# Patient Record
Sex: Female | Born: 1950 | Hispanic: Yes | Marital: Married
Health system: Southern US, Community
[De-identification: ages and names within clinical notes are randomized; demographics above are authoritative.]

## PROBLEM LIST (undated history)

## (undated) DIAGNOSIS — I10 Essential (primary) hypertension: Secondary | ICD-10-CM

## (undated) HISTORY — PX: ANKLE FRACTURE SURGERY: SHX122

---

## 2018-12-30 ENCOUNTER — Encounter (HOSPITAL_COMMUNITY): Payer: Self-pay | Admitting: *Deleted

## 2018-12-30 ENCOUNTER — Other Ambulatory Visit: Payer: Self-pay

## 2018-12-30 ENCOUNTER — Emergency Department (HOSPITAL_COMMUNITY)
Admission: EM | Admit: 2018-12-30 | Discharge: 2018-12-30 | Disposition: A | Discharge status: Home / Self Care | Payer: Medicare HMO | Attending: Emergency Medicine | Admitting: Emergency Medicine

## 2018-12-30 ENCOUNTER — Emergency Department (HOSPITAL_COMMUNITY): Payer: Medicare HMO

## 2018-12-30 DIAGNOSIS — I10 Essential (primary) hypertension: Secondary | ICD-10-CM | POA: Diagnosis not present

## 2018-12-30 DIAGNOSIS — M5431 Sciatica, right side: Secondary | ICD-10-CM

## 2018-12-30 DIAGNOSIS — M79604 Pain in right leg: Secondary | ICD-10-CM | POA: Diagnosis present

## 2018-12-30 HISTORY — DX: Essential (primary) hypertension: I10

## 2018-12-30 MED ORDER — PREDNISONE 10 MG (21) PO TBPK: ORAL_TABLET | Freq: Every day | ORAL | 0 refills | Status: AC

## 2018-12-30 MED ORDER — TRAMADOL HCL 50 MG PO TABS: 50.0000 mg | ORAL_TABLET | Freq: Four times a day (QID) | ORAL | 0 refills | Status: AC | PRN

## 2018-12-30 MED ORDER — HYDROCODONE-ACETAMINOPHEN 5-325 MG PO TABS
1.0000 | ORAL_TABLET | Freq: Once | ORAL | Status: AC
  Administered 2018-12-30: 1 via ORAL
  Filled 2018-12-30: qty 1

## 2018-12-30 NOTE — ED Notes (Signed)
Patient transported to X-ray 

## 2018-12-30 NOTE — Discharge Instructions (Signed)
Take prednisone as prescribed.  Take tramadol as needed for pain.  Follow-up with your primary care provider on Monday for continued evaluation.  Return to the ED for new or worsening symptoms or concerns, such as peeing or pooping on yourself, numbness or tingling, difficulty walking, fevers or any concerns at all.

## 2018-12-30 NOTE — ED Triage Notes (Addendum)
Pt has been having R thigh and leg pain for the past 10 days. Pt reports stepping off of a small chair and hit her leg, old bruising noted to R side of knee. Pt is worse with ambulation and when sitting down in the car pt has to lift her leg to move it due to pain. Pt has taken meloxicam and gabapentin without relief

## 2018-12-30 NOTE — ED Provider Notes (Signed)
[facility] Note   CSN: [id number] Arrival date & time: 12/30/18  1939    History   Chief Complaint Chief Complaint  Patient presents with  . Leg Pain    HPI Molly Osborne is a 68 y.o. female.    A 68 year old female, with a PMH of HTN, presents with right hip pain for 10 days.  Patient denies any injury or trauma to the hip.  She notes pain started suddenly and radiates from the buttock down the thigh.  She denies any numbness, tingling, decreased range of motion.  She states pain is worse with flexion of her right thigh.  She denies any bowel or bladder incontinence, saddle anesthesias.  She denies any fevers, chills.  Is taking gabapentin and meloxicam with no relief in her symptoms.  Of note 2 weeks ago she fell and hit her right knee.  She states her knee is not bothering her anymore.      Leg Pain Associated symptoms: no fever     Past Medical History:  Diagnosis Date  . Hypertension     There are no active problems to display for this patient.      OB History   No obstetric history on file.      Home Medications    Prior to Admission medications   Not on File    Family History No family history on file.  Social History Social History   Tobacco Use  . Smoking status: Not on file  Substance Use Topics  . Alcohol use: Not on file  . Drug use: Not on file     Allergies   Patient has no known allergies.   Review of Systems Review of Systems  Constitutional: Negative for chills and fever.  Respiratory: Negative for shortness of breath.   Cardiovascular: Negative for chest pain.  Gastrointestinal: Negative for abdominal pain, nausea and vomiting.  Musculoskeletal: Positive for arthralgias (right hip pain).     Physical Exam Updated Vital Signs BP (!) 150/91   Pulse 87   Resp 16   SpO2 97%   Physical Exam Vitals signs and nursing note reviewed.  Constitutional:      Appearance:  She is well-developed.  HENT:     Head: Normocephalic and atraumatic.  Eyes:     Conjunctiva/sclera: Conjunctivae normal.  Neck:     Musculoskeletal: Neck supple.  Cardiovascular:     Rate and Rhythm: Normal rate and regular rhythm.     Pulses:          Dorsalis pedis pulses are 3+ on the right side and 3+ on the left side.     Heart sounds: Normal heart sounds. No murmur.  Pulmonary:     Effort: Pulmonary effort is normal. No respiratory distress.     Breath sounds: Normal breath sounds. No wheezing or rales.  Abdominal:     General: Bowel sounds are normal. There is no distension.     Palpations: Abdomen is soft.     Tenderness: There is no abdominal tenderness.  Musculoskeletal: Normal range of motion.        General: No deformity.     Right hip: She exhibits tenderness (mild tenderness on palpation of the lateral hip). She exhibits normal range of motion, normal strength, no swelling and no deformity.  Skin:    General: Skin is warm and dry.     Findings: No erythema or rash.  Neurological:     Mental Status: She  is alert and oriented to person, place, and time.  Psychiatric:        Behavior: Behavior normal.      ED Treatments / Results  Labs (all labs ordered are listed, but only abnormal results are displayed) Labs Reviewed - No data to display  EKG None  Radiology Dg Hip Unilat With Pelvis 2-3 Views Right  Result Date: 12/30/2018 CLINICAL DATA:  Right hip pain and back pain. EXAM: DG HIP (WITH OR WITHOUT PELVIS) 2-3V RIGHT COMPARISON:  None. FINDINGS: There is no evidence of hip fracture or dislocation. There is no evidence of arthropathy or other focal bone abnormality. IMPRESSION: Negative. Electronically Signed   By: Dorise Bullion III M.D   On: 12/30/2018 21:38    Procedures Procedures (including critical care time)  Medications Ordered in ED Medications  HYDROcodone-acetaminophen (NORCO/VICODIN) 5-325 MG per tablet 1 tablet (1 tablet Oral Given  12/30/18 2053)     Initial Impression / Assessment and Plan / ED Course  I have reviewed the triage vital signs and the nursing notes.  Pertinent labs & imaging results that were available during my care of the patient were reviewed by me and considered in my medical decision making (see chart for details).        Patient presents with right buttock and thigh pain for 10 days.  Symptoms started after an injury to the right knee.  Her physical exam is very reassuring, no edema or erythema.  Patient has full range of motion of the right lower extremity with strength 5 out of 5, neurovascularly intact distally.  No evidence of serial occlusion, septic arthritis, DVT.  He has no bowel or bladder incontinence, saddle anesthesias, no evidence of cauda equina.  Her x-ray shows no fracture or dislocation.  She was given a dose of pain medicine and is feeling better.  She has a positive straight leg raise on the right, will try a round of steroids to treat sciatica.  Patient given strict return precautions and encouraged to follow-up closely with primary care.  She expressed understanding.  Discussed with patient and her daughter who also expressed understanding.  Patient ready and stable for discharge.  Final Clinical Impressions(s) / ED Diagnoses   Final diagnoses:  None    ED Discharge Orders    None       Rachel Moulds 12/30/18 2251    Blanchie Dessert, MD 12/30/18 2339

## 2018-12-30 NOTE — ED Notes (Signed)
Pt refused discharge vitals. Said that she "just wanted to leave."

## 2018-12-30 NOTE — ED Notes (Signed)
Patient verbalizes understanding of discharge instructions. Opportunity for questioning and answers were provided. Armband removed by staff, pt discharged from ED ambulatory.   

## 2019-01-13 ENCOUNTER — Encounter (HOSPITAL_COMMUNITY): Payer: Self-pay

## 2019-01-13 ENCOUNTER — Emergency Department (HOSPITAL_COMMUNITY): Payer: Medicare HMO

## 2019-01-13 ENCOUNTER — Emergency Department (HOSPITAL_COMMUNITY)
Admission: EM | Admit: 2019-01-13 | Discharge: 2019-01-13 | Disposition: A | Discharge status: Home / Self Care | Payer: Medicare HMO | Attending: Emergency Medicine | Admitting: Emergency Medicine

## 2019-01-13 ENCOUNTER — Other Ambulatory Visit: Payer: Self-pay

## 2019-01-13 DIAGNOSIS — I10 Essential (primary) hypertension: Secondary | ICD-10-CM | POA: Insufficient documentation

## 2019-01-13 DIAGNOSIS — J45909 Unspecified asthma, uncomplicated: Secondary | ICD-10-CM | POA: Insufficient documentation

## 2019-01-13 DIAGNOSIS — R51 Headache: Secondary | ICD-10-CM | POA: Diagnosis present

## 2019-01-13 DIAGNOSIS — Z79899 Other long term (current) drug therapy: Secondary | ICD-10-CM | POA: Diagnosis not present

## 2019-01-13 DIAGNOSIS — R519 Headache, unspecified: Secondary | ICD-10-CM

## 2019-01-13 DIAGNOSIS — R42 Dizziness and giddiness: Secondary | ICD-10-CM | POA: Diagnosis not present

## 2019-01-13 LAB — CBC WITH DIFFERENTIAL/PLATELET
Abs Immature Granulocytes: 0.07 10*3/uL (ref 0.00–0.07)
Basophils Absolute: 0 10*3/uL (ref 0.0–0.1)
Basophils Relative: 0 %
Eosinophils Absolute: 0.1 10*3/uL (ref 0.0–0.5)
Eosinophils Relative: 1 %
HCT: 42.9 % (ref 36.0–46.0)
Hemoglobin: 14.2 g/dL (ref 12.0–15.0)
Immature Granulocytes: 1 %
Lymphocytes Relative: 21 %
Lymphs Abs: 3.3 10*3/uL (ref 0.7–4.0)
MCH: 28.7 pg (ref 26.0–34.0)
MCHC: 33.1 g/dL (ref 30.0–36.0)
MCV: 86.8 fL (ref 80.0–100.0)
Monocytes Absolute: 0.9 10*3/uL (ref 0.1–1.0)
Monocytes Relative: 6 %
Neutro Abs: 11 10*3/uL — ABNORMAL HIGH (ref 1.7–7.7)
Neutrophils Relative %: 71 %
Platelets: 408 10*3/uL — ABNORMAL HIGH (ref 150–400)
RBC: 4.94 MIL/uL (ref 3.87–5.11)
RDW: 14.6 % (ref 11.5–15.5)
WBC: 15.4 10*3/uL — ABNORMAL HIGH (ref 4.0–10.5)
nRBC: 0 % (ref 0.0–0.2)

## 2019-01-13 LAB — I-STAT CREATININE, ED: Creatinine, Ser: 0.7 mg/dL (ref 0.44–1.00)

## 2019-01-13 LAB — COMPREHENSIVE METABOLIC PANEL
ALT: 32 U/L (ref 0–44)
AST: 26 U/L (ref 15–41)
Albumin: 4.8 g/dL (ref 3.5–5.0)
Alkaline Phosphatase: 85 U/L (ref 38–126)
Anion gap: 12 (ref 5–15)
BUN: 20 mg/dL (ref 8–23)
CO2: 22 mmol/L (ref 22–32)
Calcium: 9.6 mg/dL (ref 8.9–10.3)
Chloride: 103 mmol/L (ref 98–111)
Creatinine, Ser: 0.78 mg/dL (ref 0.44–1.00)
GFR calc Af Amer: >60 mL/min (ref >60)
GFR calc non Af Amer: >60 mL/min (ref >60)
Glucose, Bld: 119 mg/dL — ABNORMAL HIGH (ref 70–99)
Potassium: 3.2 mmol/L — ABNORMAL LOW (ref 3.5–5.1)
Sodium: 137 mmol/L (ref 135–145)
Total Bilirubin: 0.6 mg/dL (ref 0.3–1.2)
Total Protein: 8.3 g/dL — ABNORMAL HIGH (ref 6.5–8.1)

## 2019-01-13 LAB — ETHANOL: Alcohol, Ethyl (B): <10 mg/dL (ref <10)

## 2019-01-13 LAB — LIPASE, BLOOD: Lipase: 41 U/L (ref 11–51)

## 2019-01-13 MED ORDER — SODIUM CHLORIDE (PF) 0.9 % IJ SOLN
INTRAMUSCULAR | Status: AC
  Administered 2019-01-13: 14:00:00
  Filled 2019-01-13: qty 50

## 2019-01-13 MED ORDER — POTASSIUM CHLORIDE CRYS ER 20 MEQ PO TBCR
40.0000 meq | EXTENDED_RELEASE_TABLET | Freq: Once | ORAL | Status: AC
  Administered 2019-01-13: 40 meq via ORAL
  Filled 2019-01-13: qty 2

## 2019-01-13 MED ORDER — MECLIZINE HCL 25 MG PO TABS: 25.0000 mg | ORAL_TABLET | Freq: Three times a day (TID) | ORAL | 0 refills | Status: AC | PRN

## 2019-01-13 MED ORDER — IOHEXOL 350 MG/ML SOLN
80.0000 mL | Freq: Once | INTRAVENOUS | Status: AC | PRN
  Administered 2019-01-13: 80 mL via INTRAVENOUS

## 2019-01-13 MED ORDER — PROMETHAZINE HCL 25 MG/ML IJ SOLN
12.5000 mg | Freq: Once | INTRAMUSCULAR | Status: AC
  Administered 2019-01-13: 12.5 mg via INTRAVENOUS
  Filled 2019-01-13: qty 1

## 2019-01-13 NOTE — ED Triage Notes (Signed)
Per EMS: Pt had to pull off to the side of the road .  Pt started having dizziness and N/V around 9:30.  Pt began having severe H/A around 10.  Pt hx of vertigo.  Pt took 1 dose of meclizine.  Pt given 4 mg of zofran IV.

## 2019-01-13 NOTE — ED Provider Notes (Signed)
[facility] DEPT Provider Note   CSN: [id number] Arrival date & time: 01/13/19  1047    History   Chief Complaint No chief complaint on file.   HPI Shanelle Clontz is a 68 y.o. female with a past medical history of hypertension, asthma, who presents today for evaluation of headache.  She reports that at about 930 this morning she started feeling dizzy which she attributed to her vertigo.  She took a total of 2 pills of meclizine without significant relief.  At 1030 she had a sudden 10 out of 10 headache.  She is nauseous and has vomited 3 times.  She reports photophobia.  She was driving when this started and had to pull over she was unable to continue driving.  She denies any recent trauma.  No fevers.  She also reports neck pain bilaterally.  She denies any recent sick contacts.  No chest pain or shortness of breath.  She reports that she has had mild abdominal pain since vomiting.      HPI  Past Medical History:  Diagnosis Date   Hypertension     There are no active problems to display for this patient.   Past Surgical History:  Procedure Laterality Date   ANKLE FRACTURE SURGERY       OB History   No obstetric history on file.      Home Medications    Prior to Admission medications   Medication Sig Start Date End Date Taking? Authorizing Provider  albuterol (VENTOLIN HFA) 108 (90 Base) MCG/ACT inhaler Inhale 2 puffs into the lungs every 6 (six) hours as needed for wheezing or shortness of breath. 12/07/18   [provider]  cyclobenzaprine (FLEXERIL) 5 MG tablet Take 5-10 mg by mouth at bedtime as needed for muscle spasms. 12/07/18   [provider]  estradiol (ESTRACE) 1 MG tablet Take 1 mg by mouth daily. 12/07/18   [provider]  Fluticasone-Salmeterol (ADVAIR) 500-50 MCG/DOSE AEPB Inhale 1 puff into the lungs 2 (two) times a day. 12/07/18   [provider]  ipratropium-albuterol (DUONEB)  0.5-2.5 (3) MG/3ML SOLN Take 3 mLs by nebulization 4 (four) times daily. 12/07/18   [provider]  levothyroxine (SYNTHROID) 100 MCG tablet Take 100 mcg by mouth daily. 12/07/18   [provider]  meclizine (ANTIVERT) 25 MG tablet Take 1 tablet (25 mg total) by mouth 3 (three) times daily as needed for dizziness. 01/13/19   Lorin Glass, PA-C  metoprolol tartrate (LOPRESSOR) 25 MG tablet Take 25 mg by mouth 2 (two) times daily. 12/07/18   [provider]  predniSONE (STERAPRED UNI-PAK 21 TAB) 10 MG (21) TBPK tablet Take by mouth daily. Take 6 tabs by mouth daily  for 2 days, then 5 tabs for 2 days, then 4 tabs for 2 days, then 3 tabs for 2 days, 2 tabs for 2 days, then 1 tab by mouth daily for 2 days 12/30/18   Elie Goody, Caitlyn S, PA-C  promethazine (PHENERGAN) 25 MG tablet Take 25 mg by mouth every 6 (six) hours as needed for nausea. 12/07/18   [provider]  sertraline (ZOLOFT) 25 MG tablet Take 25 mg by mouth daily. 12/07/18   [provider]  traMADol (ULTRAM) 50 MG tablet Take 1 tablet (50 mg total) by mouth every 6 (six) hours as needed. 12/30/18   Kendrick, Caitlyn S, PA-C  Vitamin D, Ergocalciferol, (DRISDOL) 1.25 MG (50000 UT) CAPS capsule Take 1 capsule by mouth once a  week. 12/07/18   [provider]    Family History No family history on file.  Social History Social History   Tobacco Use   Smoking status: Not on file  Substance Use Topics   Alcohol use: Not on file   Drug use: Not on file     Allergies   Patient has no known allergies.   Review of Systems Review of Systems  Constitutional: Negative for chills and fever.  Eyes: Positive for photophobia. Negative for pain and visual disturbance.  Respiratory: Negative for chest tightness and shortness of breath.   Cardiovascular: Negative for chest pain.  Gastrointestinal: Positive for abdominal pain, nausea and vomiting.  Genitourinary: Negative for dysuria.    Musculoskeletal: Positive for back pain and neck pain.  Neurological: Positive for dizziness and headaches. Negative for speech difficulty, weakness, light-headedness and numbness.  All other systems reviewed and are negative.    Physical Exam Updated Vital Signs BP (!) 180/149 (BP Location: Right Arm)    Pulse 81    Temp (!) 97.5 F (36.4 C) (Oral)    Resp 16    Ht  (1.651 m)    Wt 81.6 kg    SpO2 100%    BMI 29.95 kg/m   Physical Exam Vitals signs and nursing note reviewed.  Constitutional:      General: She is not in acute distress.    Appearance: She is well-developed. She is not diaphoretic.     Comments: Patient is minimally cooperative, she undresses her self during my exam, repeatedly stating she is cold. She frequently answers "I don't know."   HENT:     Head: Normocephalic and atraumatic.  Eyes:     General: No scleral icterus.       Right eye: No discharge.        Left eye: No discharge.     Conjunctiva/sclera: Conjunctivae normal.  Neck:     Musculoskeletal: Normal range of motion and neck supple. No muscular tenderness.  Cardiovascular:     Rate and Rhythm: Normal rate and regular rhythm.     Pulses: Normal pulses.     Heart sounds: Normal heart sounds.  Pulmonary:     Effort: Pulmonary effort is normal. No respiratory distress.     Breath sounds: No stridor.  Abdominal:     General: There is no distension.     Tenderness: There is no abdominal tenderness.  Musculoskeletal:        General: No deformity.  Skin:    General: Skin is warm and dry.  Neurological:     General: No focal deficit present.     Mental Status: She is alert.     Motor: No abnormal muscle tone.     Comments: Oriented to person, place and time.  She is able to speak without slurring her words.  She is able to follow simple commands, however attempting to get her to participate in tasks such as visual tracking she is unable to do.  She is 4 extremities spontaneously.  Unable to  evaluate EOM, PEARL secondary to photophobia.    Psychiatric:        Attention and Perception: She is inattentive.        Mood and Affect: Mood is anxious. Affect is labile.      ED Treatments / Results  Labs (all labs ordered are listed, but only abnormal results are displayed) Labs Reviewed  COMPREHENSIVE METABOLIC PANEL - Abnormal; Notable for the following components:  Result Value   Potassium 3.2 (*)    Glucose, Bld 119 (*)    Total Protein 8.3 (*)    All other components within normal limits  CBC WITH DIFFERENTIAL/PLATELET - Abnormal; Notable for the following components:   WBC 15.4 (*)    Platelets 408 (*)    Neutro Abs 11.0 (*)    All other components within normal limits  LIPASE, BLOOD  ETHANOL  I-STAT CREATININE, ED    EKG EKG Interpretation  Date/Time:  Saturday January 13 2019 12:43:36 EDT Ventricular Rate:  69 PR Interval:    QRS Duration: 97 QT Interval:  435 QTC Calculation: 466 R Axis:   38 Text Interpretation:  Sinus rhythm Nonspecific T wave abnormality No previous tracing Confirmed by Cathren LaineSteinl, Kevin ([id number]) on 01/13/2019 2:57:17 PM   Radiology Ct Angio Head W/cm &/or Wo Cm  Result Date: 01/13/2019 CLINICAL DATA:  Sudden, acute headache, worst of life EXAM: CT ANGIOGRAPHY HEAD AND NECK TECHNIQUE: Multidetector CT imaging of the head and neck was performed using the standard protocol during bolus administration of intravenous contrast. Multiplanar CT image reconstructions and MIPs were obtained to evaluate the vascular anatomy. Carotid stenosis measurements (when applicable) are obtained utilizing NASCET criteria, using the distal internal carotid diameter as the denominator. CONTRAST:  80mL OMNIPAQUE IOHEXOL 350 MG/ML SOLN COMPARISON:  Head CT from earlier today FINDINGS: CTA NECK FINDINGS Aortic arch: Atherosclerosis.  Three vessel branching. Right carotid system: Vessels are smooth and widely patent with no notable atheromatous changes Left carotid  system: Vessels are smooth and widely patent. Mild plaque at the ICA bulb without ulceration. Vertebral arteries: No proximal subclavian stenosis. Mild proximal left subclavian atherosclerotic plaque that is non ulcerated. Both vertebral arteries are smooth and widely patent to the dura. Skeleton: Degenerative facet spurring Other neck: Negative Upper chest: Negative Review of the MIP images confirms the above findings CTA HEAD FINDINGS Anterior circulation: No significant stenosis, proximal occlusion, aneurysm, or vascular malformation. Posterior circulation: No significant stenosis, proximal occlusion, aneurysm, or vascular malformation. Venous sinuses: Diffusely patent Anatomic variants: None significant Delayed phase: Not obtained Review of the MIP images confirms the above findings IMPRESSION: No emergent finding or explanation for headache. Electronically Signed   By: Marnee SpringJonathon  Watts M.D.   On: 01/13/2019 14:52   Ct Head Wo Contrast  Result Date: 01/13/2019 CLINICAL DATA:  Acute onset of severe headache today. Nausea and vomiting. Dizziness. Hypertension. EXAM: CT HEAD WITHOUT CONTRAST TECHNIQUE: Contiguous axial images were obtained from the base of the skull through the vertex without intravenous contrast. COMPARISON:  None. FINDINGS: Brain: No evidence of acute infarction, hemorrhage, hydrocephalus, extra-axial collection, or mass lesion/mass effect. 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 Vascular:  No hyperdense vessel or other acute findings. Skull: No evidence of fracture or other significant bone abnormality. Sinuses/Orbits:  No acute findings. Other: None. IMPRESSION: No acute intracranial abnormality. 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 Electronically Signed   By: Danae OrleansJohn A Stahl M.D.   On: 01/13/2019 12:44   Ct Angio Neck W And/or Wo Contrast  Result Date: 01/13/2019 CLINICAL DATA:  Sudden, acute headache, worst of life EXAM: CT ANGIOGRAPHY HEAD AND  NECK TECHNIQUE: Multidetector CT imaging of the head and neck was performed using the standard protocol during bolus administration of intravenous contrast. Multiplanar CT image reconstructions and MIPs were obtained to evaluate the vascular anatomy. Carotid stenosis measurements (when applicable) are obtained utilizing NASCET criteria, using the distal internal carotid diameter as the denominator.  CONTRAST:  80mL OMNIPAQUE IOHEXOL 350 MG/ML SOLN COMPARISON:  Head CT from earlier today FINDINGS: CTA NECK FINDINGS Aortic arch: Atherosclerosis.  Three vessel branching. Right carotid system: Vessels are smooth and widely patent with no notable atheromatous changes Left carotid system: Vessels are smooth and widely patent. Mild plaque at the ICA bulb without ulceration. Vertebral arteries: No proximal subclavian stenosis. Mild proximal left subclavian atherosclerotic plaque that is non ulcerated. Both vertebral arteries are smooth and widely patent to the dura. Skeleton: Degenerative facet spurring Other neck: Negative Upper chest: Negative Review of the MIP images confirms the above findings CTA HEAD FINDINGS Anterior circulation: No significant stenosis, proximal occlusion, aneurysm, or vascular malformation. Posterior circulation: No significant stenosis, proximal occlusion, aneurysm, or vascular malformation. Venous sinuses: Diffusely patent Anatomic variants: None significant Delayed phase: Not obtained Review of the MIP images confirms the above findings IMPRESSION: No emergent finding or explanation for headache. Electronically Signed   By: Marnee SpringJonathon  Watts M.D.   On: 01/13/2019 14:52    Procedures Procedures (including critical care time)  Medications Ordered in ED Medications  potassium chloride SA (K-DUR) CR tablet 40 mEq (has no administration in time range)  promethazine (PHENERGAN) injection 12.5 mg (12.5 mg Intravenous Given 01/13/19 1346)  sodium chloride (PF) 0.9 % injection (  Given 01/13/19  1403)  iohexol (OMNIPAQUE) 350 MG/ML injection 80 mL (80 mLs Intravenous Contrast Given 01/13/19 1401)     Initial Impression / Assessment and Plan / ED Course  I have reviewed the triage vital signs and the nursing notes.  Pertinent labs & imaging results that were available during my care of the patient were reviewed by me and considered in my medical decision making (see chart for details).  Clinical Course as of Jan 12 1538  Sat Jan 13, 2019  1401 Patient reevaluated, she says that she is feeling slightly better at this time.   [EH]  1506 Patient reevaluated, she says that she feels ready to go home at this time.  Her blood pressure remains elevated however is improved with her symptoms improving she said her head is feeling better.   She requests discharge.    [EH]    Clinical Course User Index [EH] Cristina GongHammond, Dravon Nott W, PA-C      Patient presents today for evaluation of headache and dizziness.  She had taken a total of 50 mg of meclizine prior to arrival without relief of her symptoms.  On my initial exam she was agitated and exhibiting abnormal behavior including removing her clothing during exam.  Given the sudden onset of her headache combined with dizziness and her abnormal behavior concern for carotid dissection versus subarachnoid bleed.  CT head Noncon was obtained and reviewed without evidence of acute intracranial abnormalities.  CTA head and neck both without dissection, aneurysm, or other cause for her symptoms and pain.  She did not have any facial droop, slurred speech, extremity weakness, or other obvious neurologic deficit.  Labs are obtained and reviewed, she does have a slight leukocytosis at 15.4 with neutrophils of 11.0.  She does take steroids for her asthma, and recently had steroid taper for sciatica, suspect that this is combination of steroids and reactive.  She does not have any localized infectious areas and on repeat exam she was calm, acting rationally, alert  and oriented and interacting appropriate.  Do not suspect encephalitis.  Initially urine was ordered, however patient denied any urinary concerns therefore this was canceled.  Her headache was treated with Phenergan and  fluids after which she felt better and wished to go home.  Recommended PCP follow-up.  She was noted to be hypertensive while in the emergency room however this improved significantly once her headache was treated with Phenergan and fluids, and I suspect that the anxiety of being in the emergency room, combined with the pain from her headache was causing elevation in her blood pressure.  Recommended PCP follow-up with repeat check in the next 1 week.  This patient was seen as a shared visit with Dr. Denton LankSteinl.    Return precautions were discussed with patient who states their understanding.  At the time of discharge patient denied any unaddressed complaints or concerns.  Patient is agreeable for discharge home.   Final Clinical Impressions(s) / ED Diagnoses   Final diagnoses:  Acute nonintractable headache, unspecified headache type  Dizziness  Hypertension, unspecified type    ED Discharge Orders         Ordered    meclizine (ANTIVERT) 25 MG tablet  3 times daily PRN     01/13/19 1508           Cristina GongHammond, Hodges Treiber W, PA-C 01/13/19 1545    Cathren LaineSteinl, Kevin, MD 01/13/19 1559

## 2019-01-13 NOTE — Discharge Instructions (Addendum)
Today you received medications that may make you sleepy or impair your ability to make decisions.  For the next 24 hours please do not drive, operate heavy machinery, care for a small child with out another adult present, or perform any activities that may cause harm to you or someone else if you were to fall asleep or be impaired.   You are being prescribed a medication which may make you sleepy. Please follow up of listed precautions for at least 24 hours after taking one dose.  

## 2021-01-14 IMAGING — CT CT HEAD WITHOUT CONTRAST
3 series · 15 of 47 positions shown, 18 images · non-contrast
Comparison: None.

CLINICAL DATA: Acute onset of severe headache today. Nausea and
vomiting. Dizziness. Hypertension.

EXAM:
CT HEAD WITHOUT CONTRAST
TECHNIQUE: Contiguous axial images were obtained from the base of the skull
through the vertex without intravenous contrast.

[Series 2: head wo · axial · 0.47mm/px · z∈[-163,-33]mm · 9 of 32 slices shown, 12 images]
[im 3/32  brain]
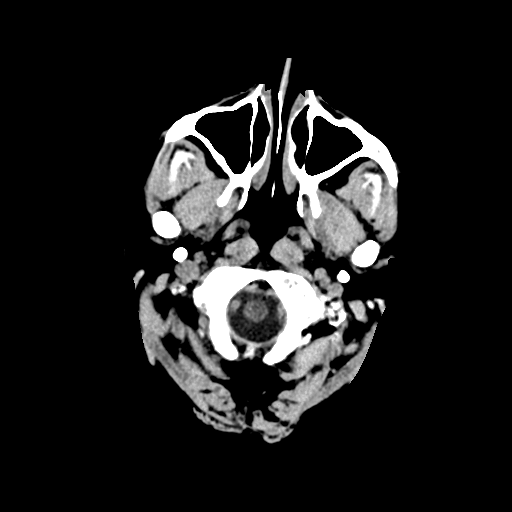
[im 3/32  bone]
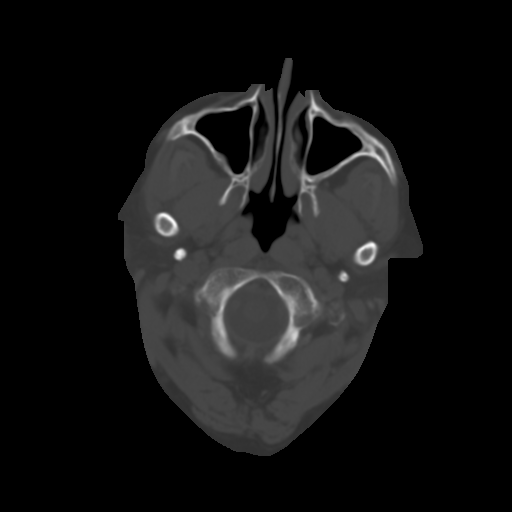
[im 6/32  brain]
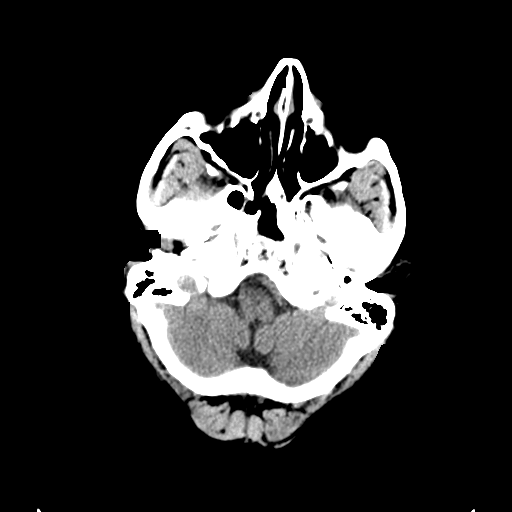
[im 9/32  brain]
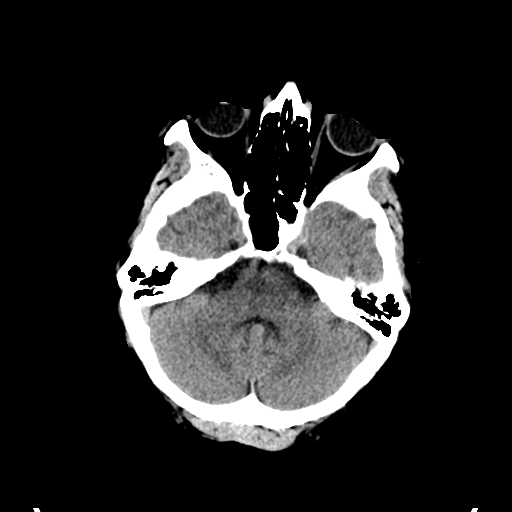
[im 12/32  brain]
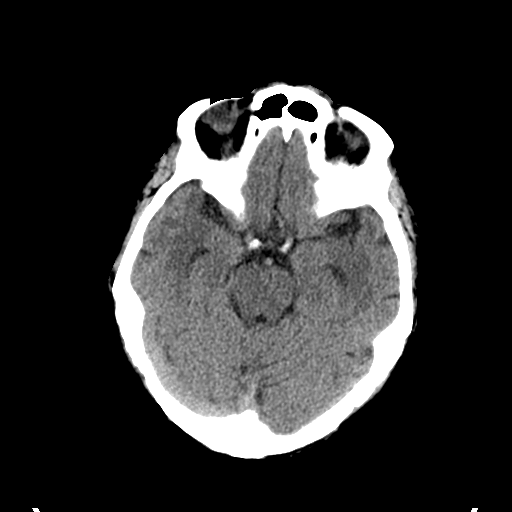
[im 17/32  brain]
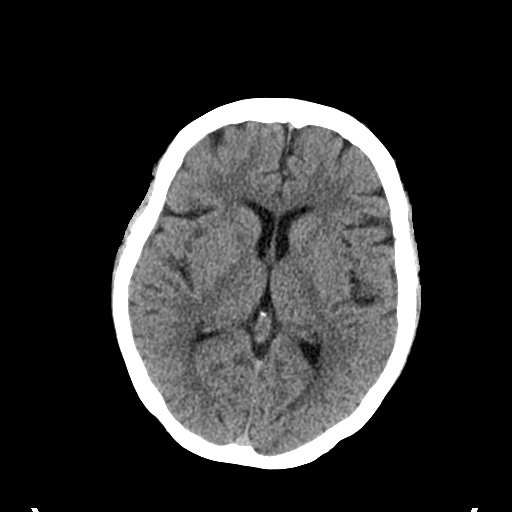
[im 17/32  bone]
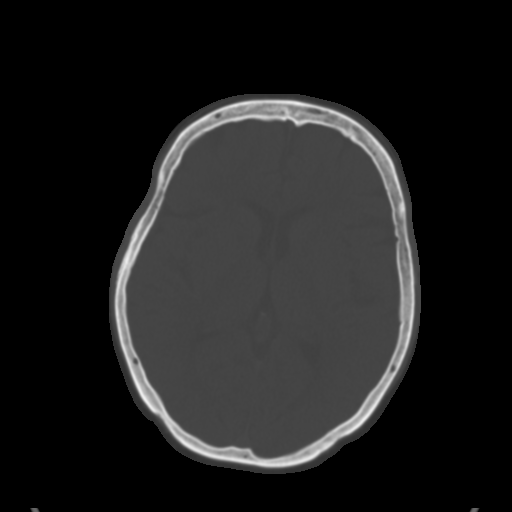
[im 20/32  brain]
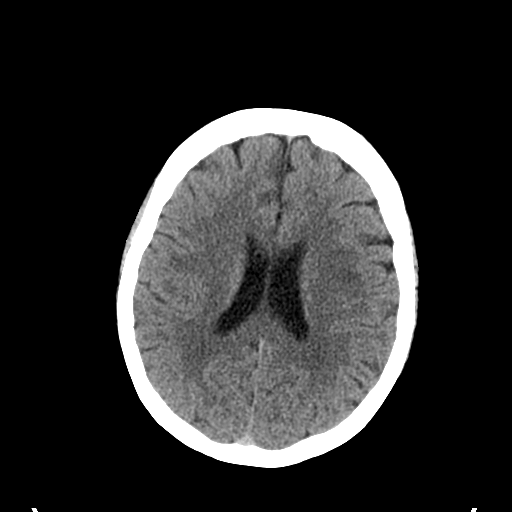
[im 23/32  brain]
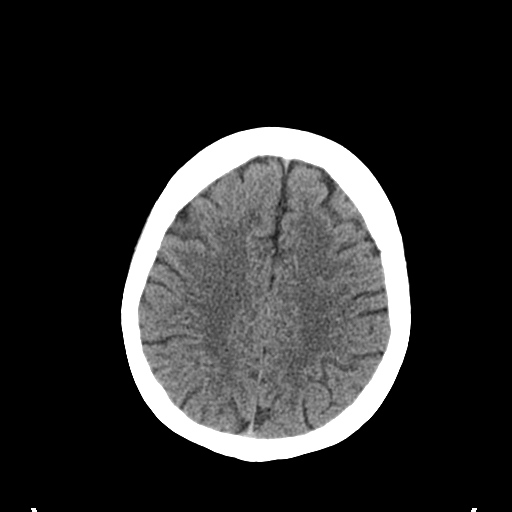
[im 26/32  brain]
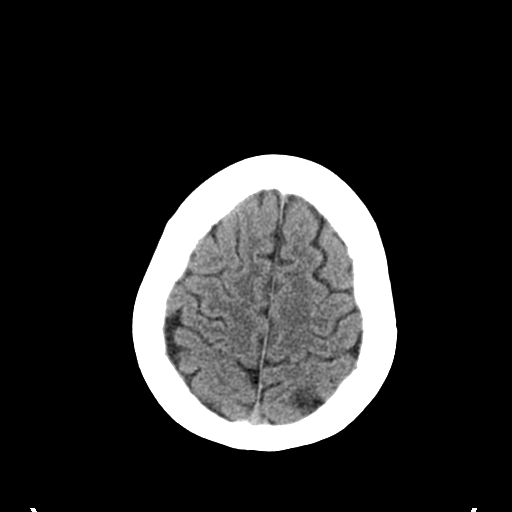
[im 29/32  brain]
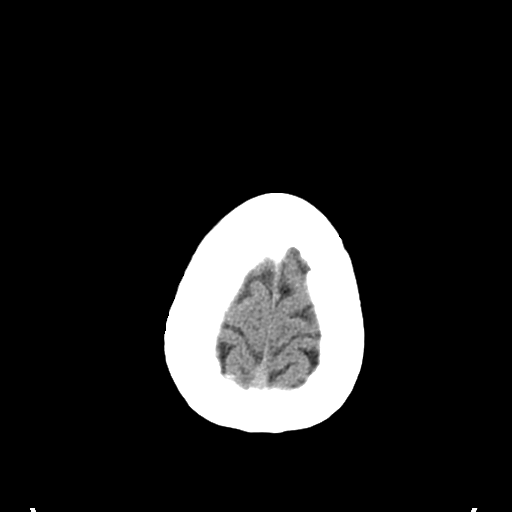
[im 29/32  bone]
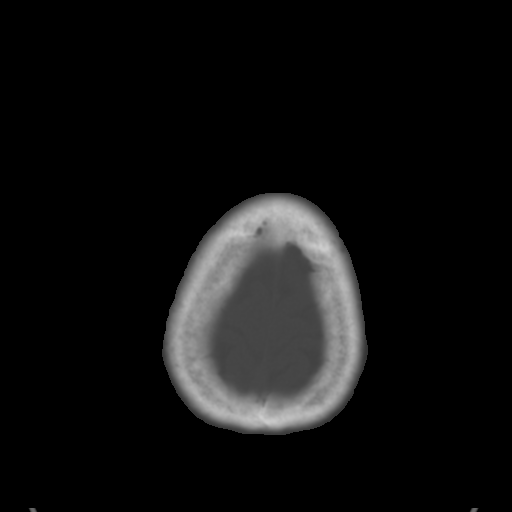

[Series 4: coronal soft tissue · coronal · 0.31mm/px · 3 of 60 slices shown]
[im 20/60  brain]
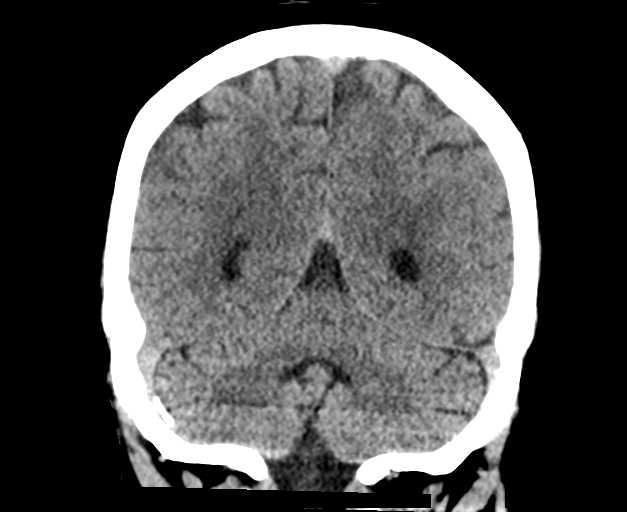
[im 27/60  brain]
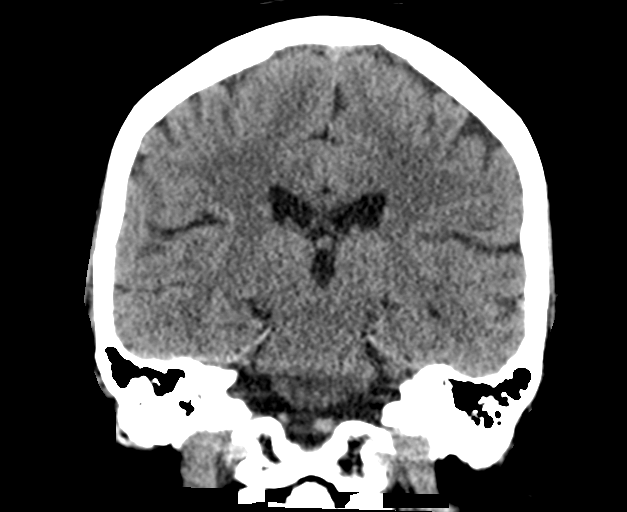
[im 33/60  brain]
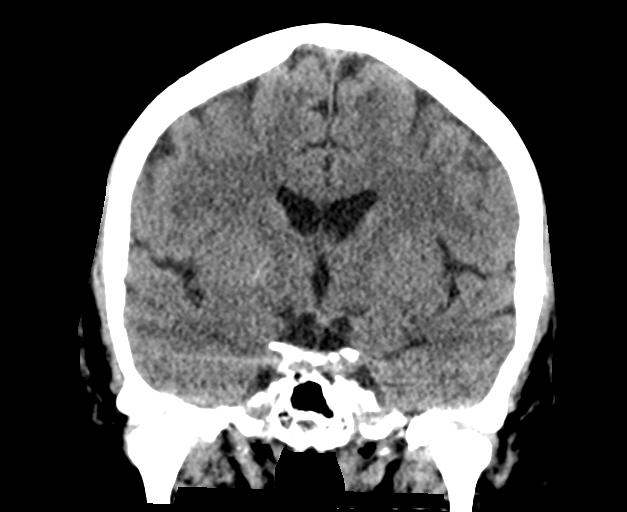

[Series 5: sagittal soft tissue · sagittal · 0.31mm/px · 3 of 49 slices shown]
[im 17/49  brain]
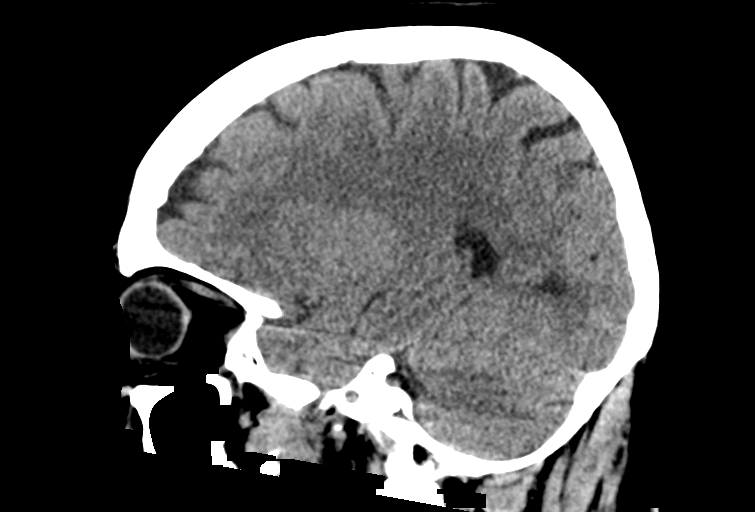
[im 25/49  brain]
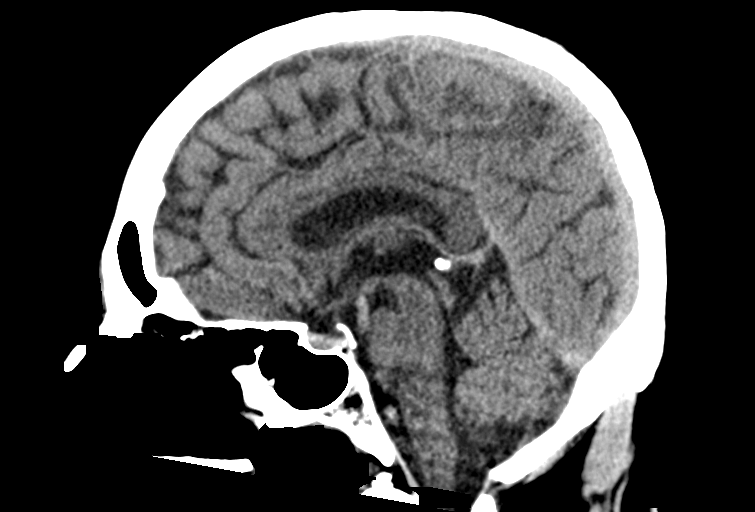
[im 33/49  brain]
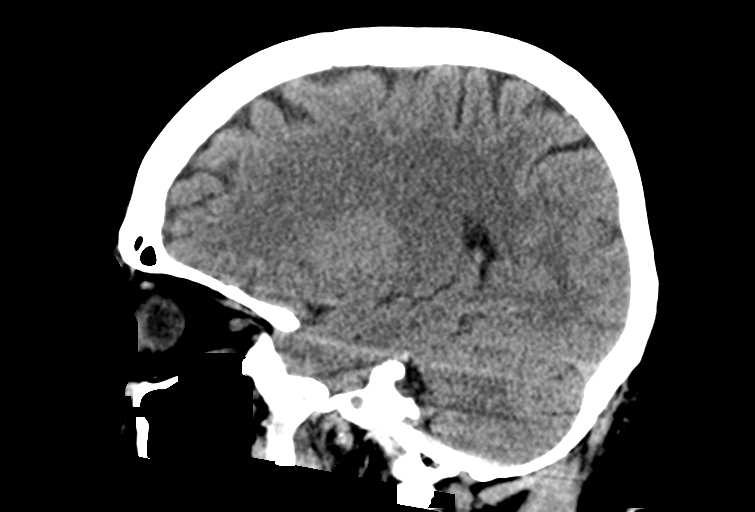

[15 of 47 positions shown; findings below may reference images not displayed]

FINDINGS: Brain: No evidence of acute infarction, hemorrhage, hydrocephalus,
extra-axial collection, or mass lesion/mass effect. Old small left
parietal subcortical white matter infarct noted.

Vascular:  No hyperdense vessel or other acute findings.

Skull: No evidence of fracture or other significant bone
abnormality.

Sinuses/Orbits:  No acute findings.

Other: None.
IMPRESSION: No acute intracranial abnormality.

Old small left parietal subcortical white matter infarct.

## 2021-01-14 IMAGING — CT CT ANGIOGRAPHY NECK
2 of 7 series · 8 of 33 positions shown · IV contrast (ISOVUE)
Comparison: Head CT from earlier today

CLINICAL DATA: Sudden, acute headache, worst of life



[Series 5: cta head neck · axial · 0.58mm/px · z∈[-173,-57]mm · 2 of 176 slices shown]
[im 59/176  soft-tissue]
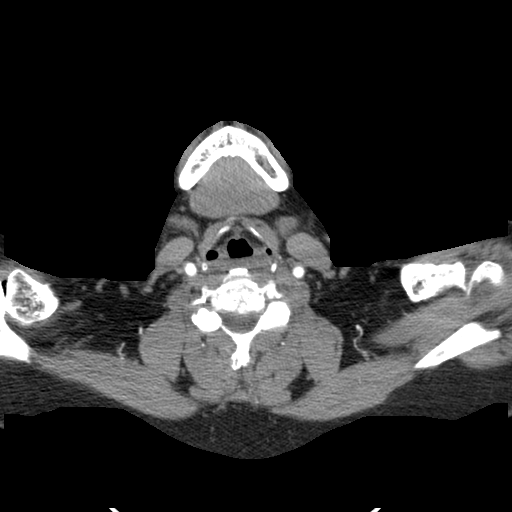
[im 117/176  bone]
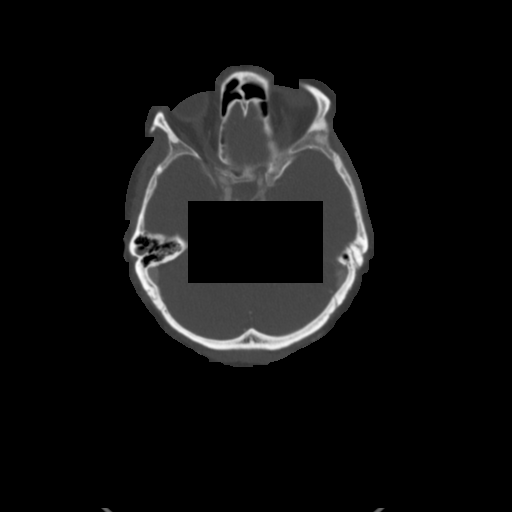

[Series 7: ax thin · axial · 0.55mm/px · z∈[-239,-1]mm · 6 of 334 slices shown]
[im 48/334  soft-tissue]
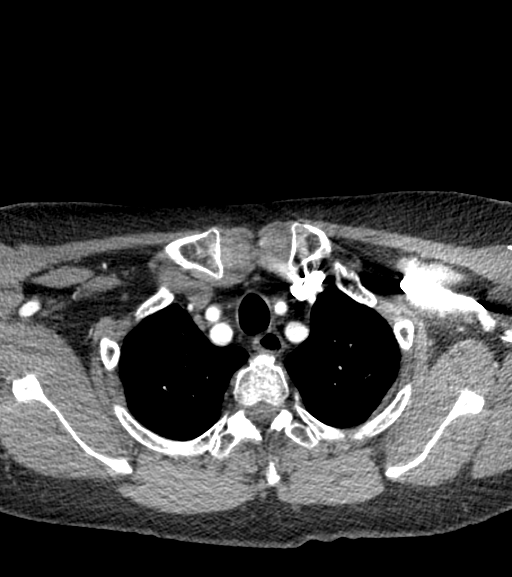
[im 96/334  soft-tissue]
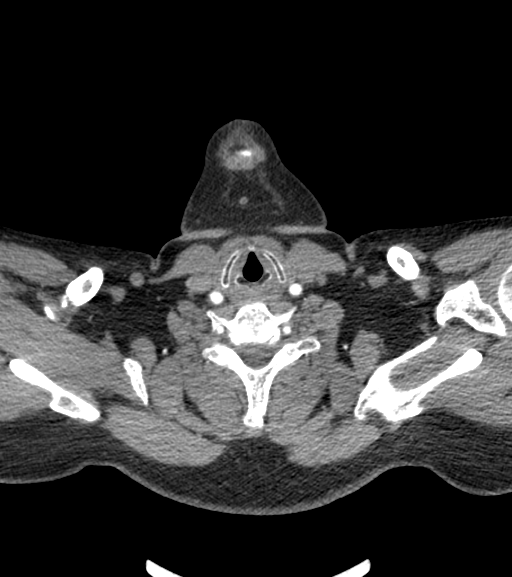
[im 143/334  soft-tissue]
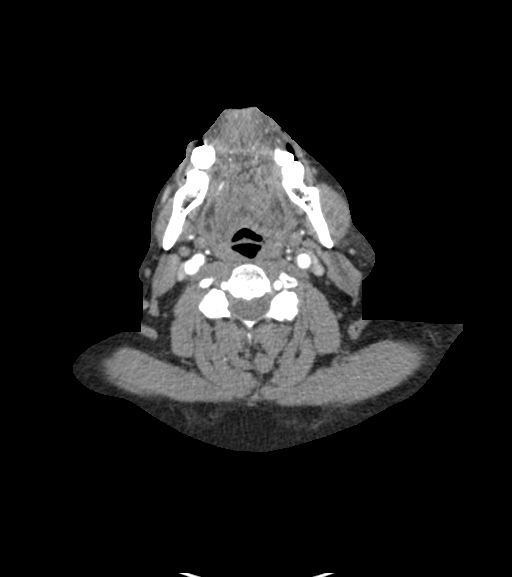
[im 191/334  soft-tissue]
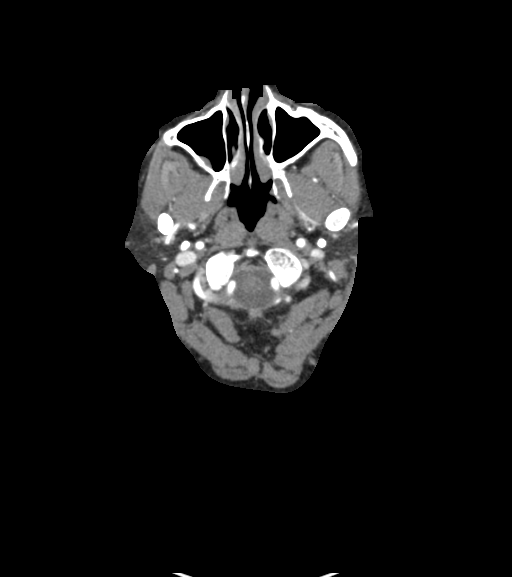
[im 238/334  soft-tissue]
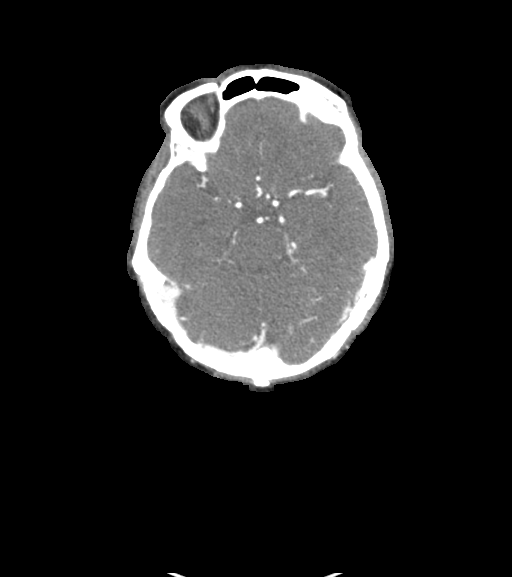
[im 286/334  soft-tissue]
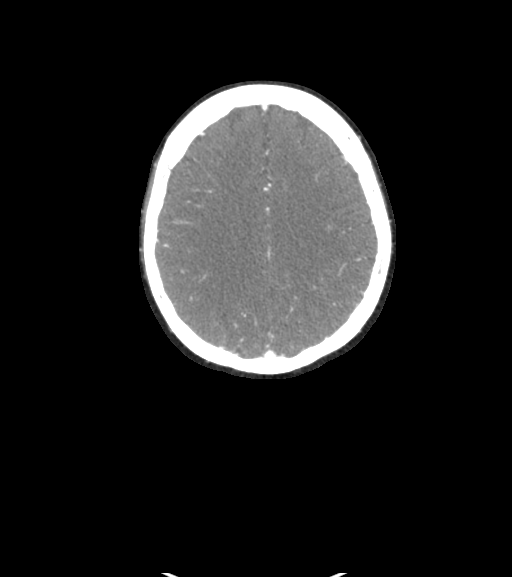

[8 of 33 positions shown; findings below may reference images not displayed]

FINDINGS: CTA NECK FINDINGS

Aortic arch: Atherosclerosis.  Three vessel branching.

Right carotid system: Vessels are smooth and widely patent with no
notable atheromatous changes

Left carotid system: Vessels are smooth and widely patent. Mild
plaque at the ICA bulb without ulceration.

Vertebral arteries: No proximal subclavian stenosis. Mild proximal
left subclavian atherosclerotic plaque that is non ulcerated. Both
vertebral arteries are smooth and widely patent to the dura.

Skeleton: Degenerative facet spurring

Other neck: Negative

Upper chest: Negative

Review of the MIP images confirms the above findings

CTA HEAD FINDINGS

Anterior circulation: No significant stenosis, proximal occlusion,
aneurysm, or vascular malformation.

Posterior circulation: No significant stenosis, proximal occlusion,
aneurysm, or vascular malformation.

Venous sinuses: Diffusely patent

Anatomic variants: None significant

Delayed phase: Not obtained

Review of the MIP images confirms the above findings
IMPRESSION: No emergent finding or explanation for headache.
